# Patient Record
Sex: Female | Born: 1980 | Race: Black or African American | Hispanic: No | Marital: Married | Smoking: Never smoker
Health system: Southern US, Community
[De-identification: ages and names within clinical notes are randomized; demographics above are authoritative.]

## PROBLEM LIST (undated history)

## (undated) DIAGNOSIS — L309 Dermatitis, unspecified: Secondary | ICD-10-CM

---

## 2004-09-25 ENCOUNTER — Emergency Department (HOSPITAL_COMMUNITY): Admission: EM | Admit: 2004-09-25 | Discharge: 2004-09-25 | Payer: Self-pay | Admitting: Family Medicine

## 2005-09-26 ENCOUNTER — Emergency Department (HOSPITAL_COMMUNITY): Admission: EM | Admit: 2005-09-26 | Discharge: 2005-09-26 | Payer: Self-pay | Admitting: Family Medicine

## 2007-02-15 ENCOUNTER — Emergency Department (HOSPITAL_COMMUNITY): Admission: EM | Admit: 2007-02-15 | Discharge: 2007-02-15 | Payer: Self-pay | Admitting: Emergency Medicine

## 2007-02-22 ENCOUNTER — Ambulatory Visit (HOSPITAL_COMMUNITY): Admission: RE | Admit: 2007-02-22 | Discharge: 2007-02-23 | Payer: Self-pay | Admitting: Specialist

## 2008-10-28 ENCOUNTER — Emergency Department (HOSPITAL_COMMUNITY): Admission: EM | Admit: 2008-10-28 | Discharge: 2008-10-28 | Payer: Self-pay | Admitting: Family Medicine

## 2008-11-09 IMAGING — RF DG ANKLE COMPLETE 3+V*L*
1 series · 4 of 4 positions shown · non-contrast
Comparison: none

CLINICAL DATA: Left ankle fracture, internal fixation. 
 LEFT ANKLE - 4 VIEW:

[Series 1: run · 4 of 4 slices shown]
[im 1/4]
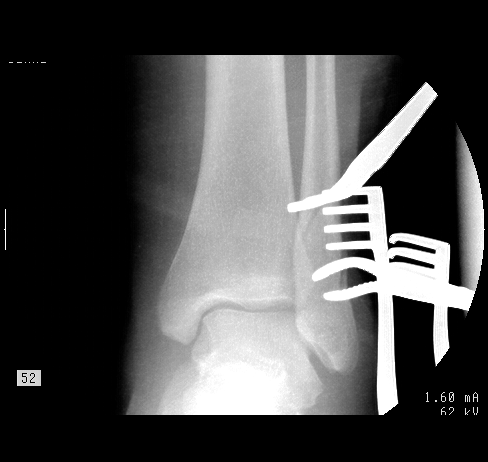
[im 2/4]
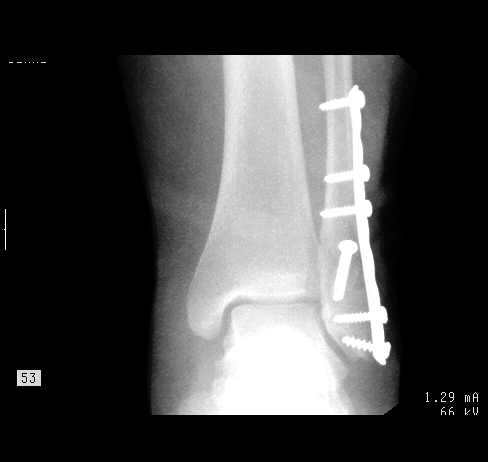
[im 3/4]
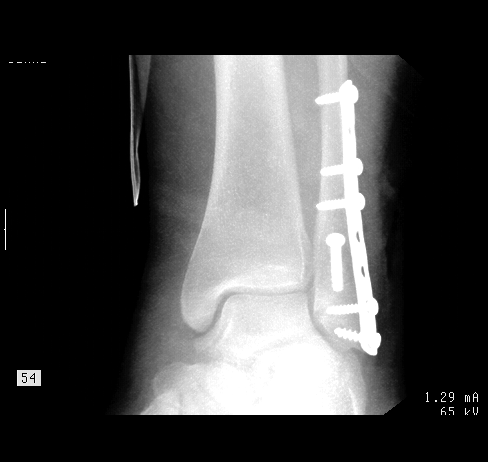
[im 4/4]
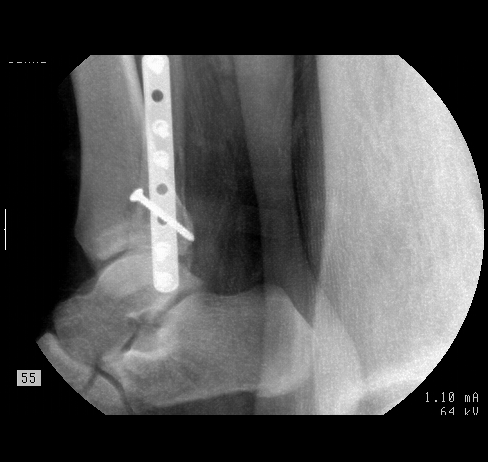

[4 of 4 positions shown; findings below may reference images not displayed]

FINDINGS: Four intraoperative spot films of the left ankle are submitted postoperatively for interpretation. These demonstrate internal plate and screw fixation of distal fibular fracture in near anatomic alignment and position.
IMPRESSION: Plate and screw fixation of the distal fibular fracture in near anatomic alignment and position.

## 2011-01-03 LAB — POCT URINALYSIS DIP (DEVICE)
Bilirubin Urine: NEGATIVE
Nitrite: NEGATIVE
Protein, ur: NEGATIVE mg/dL
pH: 6 (ref 5.0–8.0)

## 2011-01-03 LAB — POCT PREGNANCY, URINE: Preg Test, Ur: NEGATIVE

## 2011-01-31 NOTE — Op Note (Signed)
NAME:  Kelsey Parker, Kelsey Parker               ACCOUNT NO.:  [id number]   MEDICAL RECORD NO.:  [id number]          PATIENT TYPE:  OIB   LOCATION:  1615                         FACILITY:  [facility]   PHYSICIAN:  Jene Every, M.D.    DATE OF BIRTH:  07-30-1981   DATE OF PROCEDURE:  02/22/2007  DATE OF DISCHARGE:                               OPERATIVE REPORT   PREOPERATIVE DIAGNOSIS:  Fibular fracture, widening of the mortise,  left.   POSTOPERATIVE DIAGNOSIS:  Fibular fracture, widening of the mortise,  left.   PROCEDURE PERFORMED:  1. Open reduction and internal fixation left fibula.  2. Placement of short leg cast.   ANESTHESIA:  General.   SURGEON:  Jene Every, M.D.   ASSISTANT:  None.   BRIEF HISTORY AND INDICATIONS:  This is a 30 year old with fractured  fibula, widening of the mortise, operative intervention is indicated for  open reduction and internal fixation of the fibula for restoration of  the mortise and possible deltoid repair.  The risks and benefits were  discussed with him including bleeding, infection, damage to  neurovascular structures, DVT, PE, anesthetic complications, suboptimal  range of motion, need for hardware removal, etc.   SURGICAL TECHNIQUE:  With the patient in the supine position, after the  induction of adequate general anesthesia and 2 grams Kefzol, the left  lower extremity was prepped and draped in the usual sterile fashion.  The thigh tourniquet was inflated to 250 mmHg.  An incision was made  over the fibula.  The subcutaneous tissue was dissected.  Electrocautery  was utilized to achieve hemostasis.  The branches of the superficial  peroneal nerve were preserved.  The fracture site was identified.  It  was an old fracture, 29-95 days old.  It was fairly stiff.  We had to  open and curet the fracture site, curet early callus.  This was curetted  in its entirety, mobilized, and reduced anatomically with a tenaculum.  I then placed a lag screw from  cephalad to caudad and anterior to  posterior after over drilling with 3.5, drilling with the 2.5, insertion  of the appropriate length, fully threaded cortical.  Excellent reduction  with expression of fracture hematoma.  I placed an eight hole 1/3  tubular plate contoured to the inner aspect of the fibula, anti-glide,  secured distally with two cancellous screws, proximally with three  cortical screws, after the appropriate drilling, depth gauge  measurement, insertion of appropriate lag screw with excellent purchase.  The wound was copiously irrigated with the foot in supination, I felt  there was anatomic reduction of the mortise and anatomic fixation of the  fracture in the AP and lateral plane.  Next, I copiously irrigated and  closed the subcutaneous tissue with 0 and 2-0 Vicryl simple sutures, the  skin was reapproximated with staples.  Again, care was taken not to  incarcerate any of the superficial branch of the peroneal nerve.  The  tourniquet was deflated and there was adequate revascularization of the  lower extremity appreciated.  Short leg with the foot in supination was  placed, fiberglass  utilizing cold water.  Everything was well padded.  After the  appropriate curing, we placed it on top of a pillow.  The patient was  extubated without difficulty and transferred to the recovery room in  satisfactory condition.  The patient tolerated the procedure well.  No  complications.  Tourniquet time was 1 hour.      Jene Every, M.D.  Electronically Signed     JB/MEDQ  D:  02/22/2007  T:  02/22/2007  Job:  [id number]

## 2011-07-06 LAB — PREGNANCY, URINE: Preg Test, Ur: NEGATIVE

## 2020-09-08 ENCOUNTER — Ambulatory Visit (HOSPITAL_COMMUNITY)
Admission: EM | Admit: 2020-09-08 | Discharge: 2020-09-08 | Disposition: A | Discharge status: Home / Self Care | Payer: BC Managed Care – PPO | Attending: Family Medicine | Admitting: Family Medicine

## 2020-09-08 ENCOUNTER — Other Ambulatory Visit: Payer: Self-pay

## 2020-09-08 ENCOUNTER — Encounter (HOSPITAL_COMMUNITY): Payer: Self-pay | Admitting: Emergency Medicine

## 2020-09-08 DIAGNOSIS — L309 Dermatitis, unspecified: Secondary | ICD-10-CM | POA: Diagnosis not present

## 2020-09-08 HISTORY — DX: Dermatitis, unspecified: L30.9

## 2020-09-08 MED ORDER — TRIAMCINOLONE ACETONIDE 40 MG/ML IJ SUSP
40.0000 mg | Freq: Once | INTRAMUSCULAR | Status: AC
  Administered 2020-09-08: 40 mg via INTRAMUSCULAR

## 2020-09-08 MED ORDER — TRIAMCINOLONE ACETONIDE 40 MG/ML IJ SUSP
INTRAMUSCULAR | Status: AC
  Filled 2020-09-08: qty 1

## 2020-09-08 MED ORDER — TRIAMCINOLONE ACETONIDE 0.1 % EX CREA: 1.0000 "application " | TOPICAL_CREAM | Freq: Two times a day (BID) | CUTANEOUS | 1 refills | Status: DC

## 2020-09-08 NOTE — ED Triage Notes (Signed)
Pt states that she noticed  her eczema flare up started a month ago. She states that she tried to get in with her Dermatologist but the next appt is three months out from now. Pt states that she normally uses triamcinolone.

## 2020-09-09 NOTE — ED Provider Notes (Signed)
[facility]    CSN: [id number] Arrival date & time: 09/08/20  1938      History   Chief Complaint Chief Complaint  Patient presents with   Eczema    HPI Kelsey Parker is a 39 y.o. female.   Patient presenting today with diffuse eczema flare across abdomen, back and neck that has been progressing the past month since being out of her triamcinolone cream. Typically followed by Dermatology for this but cannot get an appt for a few weeks. Very itchy and uncomfortable. Trying OTC aveeno without any relief. No other concerns or sxs today.      Past Medical History:  Diagnosis Date   Eczema     There are no problems to display for this patient.   History reviewed. No pertinent surgical history.  OB History   No obstetric history on file.      Home Medications    Prior to Admission medications   Medication Sig Start Date End Date Taking? Authorizing Provider  triamcinolone (KENALOG) 0.1 % Apply 1 application topically 2 (two) times daily. 09/08/20   Particia Nearing, PA-C    Family History Family History  Problem Relation Age of Onset   Diabetes Mother     Social History Social History   Tobacco Use   Smoking status: Never Smoker   Smokeless tobacco: Never Used  Substance Use Topics   Alcohol use: Yes    Comment: social   Drug use: Never     Allergies   Patient has no known allergies.   Review of Systems Review of Systems PER HPI    Physical Exam Triage Vital Signs ED Triage Vitals  Enc Vitals Group     BP 09/08/20 2011 (!) 150/91     Pulse Rate 09/08/20 2011 69     Resp 09/08/20 2011 20     Temp --      Temp Source 09/08/20 2011 Oral     SpO2 09/08/20 2011 98 %     Weight --      Height --      Head Circumference --      Peak Flow --      Pain Score 09/08/20 2006 0     Pain Loc --      Pain Edu? --      Excl. in GC? --    No data found.  Updated Vital Signs BP (!) 150/91 (BP Location: Left Wrist)     Pulse 69    Resp 20    LMP 08/23/2020 (Approximate)    SpO2 98%   Visual Acuity Right Eye Distance:   Left Eye Distance:   Bilateral Distance:    Right Eye Near:   Left Eye Near:    Bilateral Near:     Physical Exam Vitals and nursing note reviewed.  Constitutional:      Appearance: Normal appearance. She is not ill-appearing.  HENT:     Head: Atraumatic.  Eyes:     Extraocular Movements: Extraocular movements intact.     Conjunctiva/sclera: Conjunctivae normal.  Cardiovascular:     Rate and Rhythm: Normal rate and regular rhythm.     Heart sounds: Normal heart sounds.  Pulmonary:     Effort: Pulmonary effort is normal.     Breath sounds: Normal breath sounds.  Musculoskeletal:        General: Normal range of motion.     Cervical back: Normal range of motion and neck supple.  Skin:  General: Skin is warm and dry.     Findings: Rash (hyperpigmented maculopapular rash across neck b/l, abdomen and back with areas of scabbing from itching) present.  Neurological:     Mental Status: She is alert and oriented to person, place, and time.  Psychiatric:        Mood and Affect: Mood normal.        Thought Content: Thought content normal.        Judgment: Judgment normal.      UC Treatments / Results  Labs (all labs ordered are listed, but only abnormal results are displayed) Labs Reviewed - No data to display  EKG   Radiology No results found.  Procedures Procedures (including critical care time)  Medications Ordered in UC Medications  triamcinolone acetonide (KENALOG-40) injection 40 mg (40 mg Intramuscular Given 09/08/20 2040)    Initial Impression / Assessment and Plan / UC Course  I have reviewed the triage vital signs and the nursing notes.  Pertinent labs & imaging results that were available during my care of the patient were reviewed by me and considered in my medical decision making (see chart for details).     IM kenalog, triamcinolone script  given today until she can f/u with Dermatology for recheck. Discussed good moisturizing regimen and other home care.   Final Clinical Impressions(s) / UC Diagnoses   Final diagnoses:  None   Discharge Instructions   None    ED Prescriptions    Medication Sig Dispense Auth. Provider   triamcinolone (KENALOG) 0.1 % Apply 1 application topically 2 (two) times daily. 453.6 g Particia Nearing, [place]     PDMP not reviewed this encounter.   Particia Nearing, [place] 09/09/20 1036

## 2021-09-14 ENCOUNTER — Other Ambulatory Visit: Payer: Self-pay | Admitting: Family Medicine

## 2021-09-15 NOTE — Telephone Encounter (Signed)
Requested Prescriptions  Pending Prescriptions Disp Refills   triamcinolone cream (KENALOG) 0.1 % [Pharmacy Med Name: TRIAMCINOLONE 0.1% CREAM 454GM] 454 g     Sig: APPLY TOPICALLY TWICE DAILY     Dermatology:  Corticosteroids Failed - 09/14/2021  6:55 PM      Failed - Valid encounter within last 12 months    Recent Outpatient Visits   None

## 2021-12-13 ENCOUNTER — Other Ambulatory Visit: Payer: Self-pay

## 2021-12-13 ENCOUNTER — Ambulatory Visit: Payer: BC Managed Care – PPO | Admitting: Physician Assistant

## 2021-12-13 ENCOUNTER — Encounter: Payer: Self-pay | Admitting: Physician Assistant

## 2021-12-13 ENCOUNTER — Telehealth: Payer: Self-pay | Admitting: *Deleted

## 2021-12-13 DIAGNOSIS — L209 Atopic dermatitis, unspecified: Secondary | ICD-10-CM

## 2021-12-13 DIAGNOSIS — L304 Erythema intertrigo: Secondary | ICD-10-CM

## 2021-12-13 MED ORDER — TACROLIMUS 0.1 % EX OINT: TOPICAL_OINTMENT | Freq: Every day | CUTANEOUS | 1 refills | Status: AC

## 2021-12-13 MED ORDER — TRIAMCINOLONE ACETONIDE 0.1 % EX OINT: 1.0000 "application " | TOPICAL_OINTMENT | Freq: Every day | CUTANEOUS | 6 refills | Status: AC

## 2021-12-13 NOTE — Progress Notes (Signed)
? ?  New Patient ?  ?Subjective  ?Kelsey Parker is a 41 y.o. female who presents for the following: New Patient (Initial Visit) (Patient here today for eczema x years per patient she needs a refill on her TAC which has helped in the past. Per patient she has eczema on her arms, face and back, patient would like to discuss other treatment options. ). She also gets irritation under her breasts especially in the summer when it gets hot. 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  ? ? ?The following portions of the chart were reviewed this encounter and updated as appropriate:  Tobacco  Allergies  Meds  Problems  Med Hx  Surg Hx  Fam Hx   ?  ? ?Objective  ?Well appearing patient in no apparent distress; mood and affect are within normal limits. ? ?A full examination was performed including scalp, head, eyes, ears, nose, lips, neck, chest, axillae, abdomen, back, buttocks, bilateral upper extremities, bilateral lower extremities, hands, feet, fingers, toes, fingernails, and toenails. All findings within normal limits unless otherwise noted below. ? ?Chest (Upper Torso, Anterior), Head - Anterior (Face), Left Antecubital Fossa, Left Popliteal Fossa, Right Antecubital Fossa, Right Popliteal Fossa, Scalp, Torso - Posterior (Back) ?Hyperkeratosis and hyperpigmented scale ? ? ? ? ? ? ? ? ? ? ? ? ? ? ? ? ? ? ?Left Inframammary Fold, Right Inframammary Fold ?Erythema and hyperpigmented scale ? ? ?Assessment & Plan  ?Atopic dermatitis, unspecified type ?Head - Anterior (Face); Left Antecubital Fossa; Right Antecubital Fossa; Left Popliteal Fossa; Right Popliteal Fossa; Chest (Upper Torso, Anterior); Torso - Posterior (Back); Scalp ? ?tacrolimus (PROTOPIC) 0.1 % ointment - Chest (Upper Torso, Anterior), Head - Anterior (Face), Left Antecubital Fossa, Left Popliteal Fossa, Right Antecubital Fossa, Right Popliteal Fossa, Scalp, Torso - Posterior (Back) ?Apply topically at bedtime. ? ?triamcinolone ointment  (KENALOG) 0.1 % - Chest (Upper Torso, Anterior), Head - Anterior (Face), Left Antecubital Fossa, Left Popliteal Fossa, Right Antecubital Fossa, Right Popliteal Fossa, Scalp, Torso - Posterior (Back) ?Apply 1 application. topically daily. ? ?Erythema intertrigo ?Left Inframammary Fold; Right Inframammary Fold ? ?OTC clotrimazole and hydrocortisone- mix together and apply once to twice a day. Dry thoroughly after bathing.  ? ? ? ? ?I, Mckinsley Koelzer, PA-C, have reviewed all documentation's for this visit.  The documentation on 12/13/21 for the exam, diagnosis, procedures and orders are all accurate and complete. ?

## 2021-12-13 NOTE — Patient Instructions (Addendum)
Dupilumab Injection ?What is this medication? ?DUPILUMAB (doo PIL ue mab) treats eczema, eosinophilic esophagitis, and sinus inflammation with nasal polyps. It decreases inflammation that contributes to these conditions. It is also used to prevent the symptoms of asthma. It works by decreasing inflammation of the airways, making it easier to breathe. Do not use it to treat a sudden asthma attack. ?This medicine may be used for other purposes; ask your health care provider or pharmacist if you have questions. ?COMMON BRAND NAME(S): DUPIXENT ?What should I tell my care team before I take this medication? ?They need to know if you have any of these conditions: ?Asthma ?Eye disease ?Parasitic (helminth) infection ?An unusual or allergic reaction to dupilumab, other medicines, foods, dyes, or preservatives ?Pregnant or trying to get pregnant ?Breast-feeding ?How should I use this medication? ?This medication is injected under the skin. You will be taught how to prepare and give it. Take it as directed on the prescription label. Keep taking it unless your care team tells you to stop. ?If you use a pen, be sure to take off the outer needle cover before using the dose. ?It is important that you put your used needles and syringes in a special sharps container. Do not put them in a trash can. If you do not have a sharps container, call your pharmacist or care team to get one. ?A patient package insert for the product will be given with each prescription and refill. Be sure to read this information carefully each time. The sheet may change often. ?This medication comes with INSTRUCTIONS FOR USE. Ask your pharmacist for directions on how to use this medication. Read the information carefully. Talk to your care team if you have questions. ?Talk to your care team about the use of this medication in children. While this medication may be prescribed for children as young as 6 years for selected conditions, precautions do  apply. ?Overdosage: If you think you have taken too much of this medicine contact a poison control center or emergency room at once. ?NOTE: This medicine is only for you. Do not share this medicine with others. ?What if I miss a dose? ?It is important not to miss any doses. Talk to your care team about what to do if you miss a dose. ?What may interact with this medication? ?Interactions are not expected. ?This list may not describe all possible interactions. Give your health care provider a list of all the medicines, herbs, non-prescription drugs, or dietary supplements you use. Also tell them if you smoke, drink alcohol, or use illegal drugs. Some items may interact with your medicine. ?What should I watch for while using this medication? ?Visit your care team for regular checks on your progress. Tell your care team if your symptoms do not start to get better or if they get worse. ?This medication can decrease the response to a vaccine. If you need to get vaccinated, tell your care team if you have received this medication. Talk to your care team to see if a different vaccination schedule is needed. ?If you take this medication for asthma, you and your care team should develop an Asthma Action Plan that is just for you. Be sure to know what to do if you are in the yellow (asthma is getting worse) or red (medical alert) zones. This medication is not used to treat sudden breathing problems. ?What side effects may I notice from receiving this medication? ?Side effects that you should report to your care team as  soon as possible: ?Allergic reactions--skin rash, itching, hives, swelling of the face, lips, tongue, or throat ?Severe joint pain ?Sudden eye pain or change in vision such as blurry vision ?Vasculitis--Unusual weakness or fatigue, fever, headache, skin rash, muscle or joint pain, loss of appetite, pain, tingling, or numbness in the hands or feet ?Side effects that usually do not require medical attention  (report these to your care team if they continue or are bothersome): ?Cold sore ?Dry eyes ?Joint pain ?Pain, redness, or irritation at injection site ?Sore throat ?Trouble sleeping ?This list may not describe all possible side effects. Call your doctor for medical advice about side effects. You may report side effects to FDA at 1-800-FDA-1088. ?Where should I keep my medication? ?Keep out of the reach of children and pets. ?Store in the refrigerator at 2 to 8 degrees C (36 to 46 degrees F). Do not freeze. Do not shake. Keep this medication in the original packaging until you are ready to take it. Protect from light. Get rid of any unused medication after the expiration date. ?This medication may be stored at room temperature up to 25 degrees C (77 degrees F) for up to 14 days. Keep this medication in the original packaging. Protect from light. If it is stored at room temperature, get rid of any unused medication after 14 days or after it expires, whichever is first. ?To get rid of medications that are no longer needed or have expired: ?Take the medication to a medication take-back program. Check with your pharmacy or law enforcement to find a location. ?If you cannot return the medication, ask your pharmacist or care team how to get rid of this medication safely. ?NOTE: This sheet is a summary. It may not cover all possible information. If you have questions about this medicine, talk to your doctor, pharmacist, or health care provider. ?? 2022 Elsevier/Gold Standard (2021-02-15 00:00:00) ? ? ?Clotrimazole and Hydrocortisone for under your breast.  ? ?

## 2021-12-13 NOTE — Telephone Encounter (Signed)
Sent patients information to [place] my way and uploaded patients new prescription and office notes to the [facility] portal.  ?

## 2021-12-14 NOTE — Telephone Encounter (Signed)
[facility]up[facility] my way received patients enrollment form.  ?

## 2021-12-19 ENCOUNTER — Telehealth: Payer: Self-pay

## 2021-12-19 NOTE — Telephone Encounter (Signed)
Spoke with Shay from [employer] about the patient's BSA%. ? ? ? ?10:32Shay Freida Busman joined the chat ?10:32Chat assigned to [employer] ?Hello, how can I help you today? ? ? ?10:32 ?Hello, you all requested a BSA % on patient Kelsey Parker DOB 1981/04/05 ? ? ?10:33 ?If you check the electronic prescription the BSA is listed as 30% ? ? ?10:34 ?Yes, I see it here. I'm sorry that the fax was sent to you. I will send this back over to the PA team ? ?10:34 ?Thank you ? ? ?10:34 ? ?

## 2021-12-19 NOTE — Telephone Encounter (Signed)
Fax received from [place] stating the need the patient's BSA%.  ?

## 2022-01-05 ENCOUNTER — Telehealth: Payer: Self-pay

## 2022-01-05 NOTE — Telephone Encounter (Signed)
Phone call to patient to let her know that we will need her to come by the office to sign the appeal authorization for her [place]. Voicemail left for patient to give the office a call back.  ?

## 2022-01-05 NOTE — Telephone Encounter (Signed)
Fax received from patient's insurance needing her to sign an appeal authorization. And a letter of medical necessity to be sign by the provider.  ?

## 2022-01-11 NOTE — Telephone Encounter (Signed)
Phone call to patient to inform her that we need her to come by and sign authorization for [place] to do an appeal for her [place]. Patient states that she's received her Dupixent and she needs to know how to do the injection. Patient scheduled nurse visit for Wednesday for injection training.  ?

## 2022-01-18 ENCOUNTER — Ambulatory Visit (INDEPENDENT_AMBULATORY_CARE_PROVIDER_SITE_OTHER): Payer: BC Managed Care – PPO | Admitting: *Deleted

## 2022-01-18 DIAGNOSIS — L209 Atopic dermatitis, unspecified: Secondary | ICD-10-CM

## 2022-01-18 MED ORDER — DUPILUMAB 300 MG/2ML ~~LOC~~ SOAJ
600.0000 mg | Freq: Once | SUBCUTANEOUS | Status: AC
  Administered 2022-01-18: 600 mg via SUBCUTANEOUS

## 2022-01-18 NOTE — Progress Notes (Signed)
Here for Dupixent training. Injected 600 mg in right and left thigh. Patient tolerated well.  ? ?[id number] ?EXP-2023-11-16 ?

## 2022-01-23 ENCOUNTER — Telehealth: Payer: Self-pay | Admitting: *Deleted

## 2022-01-23 NOTE — Telephone Encounter (Signed)
Sent appeal letter in to [facility] via the portal.  ?

## 2022-01-23 NOTE — Telephone Encounter (Signed)
Faxed the appeal letter to [facility] for patients Dupixent.  ?

## 2022-02-23 ENCOUNTER — Telehealth: Payer: Self-pay | Admitting: *Deleted

## 2022-02-23 NOTE — Telephone Encounter (Signed)
Phone call from [facility] saying they don't have any documentation that she has tried and failed the tacrolimus and triamcinolone. Told her she had full body eczema and a topical wont help. She said this will probably be denied. Patient should be able to get [facility] Jacinto med through my way if denied.

## 2022-04-12 ENCOUNTER — Telehealth: Payer: Self-pay | Admitting: Physician Assistant

## 2022-12-20 ENCOUNTER — Ambulatory Visit: Payer: BC Managed Care – PPO | Admitting: Physician Assistant

## 2024-02-20 ENCOUNTER — Ambulatory Visit: Payer: Self-pay

## 2024-02-20 NOTE — Telephone Encounter (Signed)
 FYI Only or Action Required?: FYI only for provider  Patient is followed in Pulmonology for New Patient, last seen on NP. Called Nurse Triage reporting Shortness of Breath. Symptoms began several months ago. Interventions attempted: Rescue inhaler and Increased fluids/rest. Symptoms are: unchanged.  Triage Disposition: See Physician Within 24 Hours (overriding See HCP Within 4 Hours (Or PCP Triage))  Patient/caregiver understands and will follow disposition?: Yes        Copied from CRM [id number]. Topic: Clinical - Red Word Triage >> Feb 20, 2024  3:21 PM Eveleen Hinds B wrote: [facility] that prompted transfer to Nurse Triage: shortness of breath Reason for Disposition  [1] MILD difficulty breathing (e.g., minimal/no SOB at rest, SOB with walking, pulse <100) AND [2] NEW-onset or WORSE than normal  Answer Assessment - Initial Assessment Questions 1. RESPIRATORY STATUS: "Describe your breathing?" (e.g., wheezing, shortness of breath, unable to speak, severe coughing)      Shortness of Breath 2. ONSET: "When did this breathing problem begin?"      Last September 3. PATTERN "Does the difficult breathing come and go, or has it been constant since it started?"      Intermittent 4. SEVERITY: "How bad is your breathing?" (e.g., mild, moderate, severe)    - MILD: No SOB at rest, mild SOB with walking, speaks normally in sentences, can lie down, no retractions, pulse < 100.    - MODERATE: SOB at rest, SOB with minimal exertion and prefers to sit, cannot lie down flat, speaks in phrases, mild retractions, audible wheezing, pulse 100-120.    - SEVERE: Very SOB at rest, speaks in single words, struggling to breathe, sitting hunched forward, retractions, pulse > 120      Moderate at times, SOB easier  6. CARDIAC HISTORY: "Do you have any history of heart disease?" (e.g., heart attack, angina, bypass surgery, angioplasty)      None 7. LUNG HISTORY: "Do you have any history of lung disease?"  (e.g.,  pulmonary embolus, asthma, emphysema)     None 8. CAUSE: "What do you think is causing the breathing problem?"      Unknown 9. OTHER SYMPTOMS: "Do you have any other symptoms? (e.g., dizziness, runny nose, cough, chest pain, fever)     Chest tightness, cough, runny nose 10. O2 SATURATION MONITOR:  "Do you use an oxygen saturation monitor (pulse oximeter) at home?" If Yes, ask: "What is your reading (oxygen level) today?" "What is your usual oxygen saturation reading?" (e.g., 95%)       Drops to 93% with activity   Pt reports ongoing SOB since last September. Intermittent with coughing episodes/exertion Monitoring SpO2 levels, 93% when SOB  Protocols used: Breathing Difficulty-A-AH

## 2024-02-21 ENCOUNTER — Ambulatory Visit (HOSPITAL_BASED_OUTPATIENT_CLINIC_OR_DEPARTMENT_OTHER): Payer: Self-pay | Admitting: Adult Health

## 2024-02-21 NOTE — Telephone Encounter (Signed)
 Pt notified we have nothing earlier available for an appt. She should followup with her PCP in the meantime of waiting for her appt

## 2024-04-18 ENCOUNTER — Encounter: Payer: Self-pay | Admitting: Pulmonary Disease

## 2024-04-18 ENCOUNTER — Ambulatory Visit: Payer: Self-pay | Admitting: Pulmonary Disease

## 2024-04-18 ENCOUNTER — Ambulatory Visit: Payer: Self-pay | Admitting: Internal Medicine

## 2024-04-18 ENCOUNTER — Ambulatory Visit

## 2024-04-18 VITALS — BP 134/82 | HR 72 | Temp 98.9°F | Ht 67.0 in | Wt 320.6 lb

## 2024-04-18 DIAGNOSIS — R0689 Other abnormalities of breathing: Secondary | ICD-10-CM

## 2024-04-18 DIAGNOSIS — R0600 Dyspnea, unspecified: Secondary | ICD-10-CM

## 2024-04-18 LAB — CBC WITH DIFFERENTIAL/PLATELET
Basophils Absolute: 0.1 K/uL (ref 0.0–0.1)
Basophils Relative: 0.8 % (ref 0.0–3.0)
Eosinophils Absolute: 0.3 K/uL (ref 0.0–0.7)
Eosinophils Relative: 4.3 % (ref 0.0–5.0)
HCT: 32.6 % — ABNORMAL LOW (ref 36.0–46.0)
Hemoglobin: 10.3 g/dL — ABNORMAL LOW (ref 12.0–15.0)
Lymphocytes Relative: 43.5 % (ref 12.0–46.0)
Lymphs Abs: 2.8 K/uL (ref 0.7–4.0)
MCHC: 31.5 g/dL (ref 30.0–36.0)
MCV: 70.7 fl — ABNORMAL LOW (ref 78.0–100.0)
Monocytes Absolute: 0.4 K/uL (ref 0.1–1.0)
Monocytes Relative: 6.4 % (ref 3.0–12.0)
Neutro Abs: 2.9 K/uL (ref 1.4–7.7)
Neutrophils Relative %: 45 % (ref 43.0–77.0)
Platelets: 235 K/uL (ref 150.0–400.0)
RBC: 4.6 Mil/uL (ref 3.87–5.11)
RDW: 18.4 % — ABNORMAL HIGH (ref 11.5–15.5)
WBC: 6.4 K/uL (ref 4.0–10.5)

## 2024-04-18 MED ORDER — BUDESONIDE-FORMOTEROL FUMARATE 160-4.5 MCG/ACT IN AERO: 2.0000 | INHALATION_SPRAY | Freq: Two times a day (BID) | RESPIRATORY_TRACT | 11 refills | Status: AC

## 2024-04-18 NOTE — Progress Notes (Addendum)
 Kelsey Parker    [id number]    24-Nov-1980  Primary Care Physician:Shelton, Suzen, MD  Referring Physician: Theo Suzen, MD [address] [place],  [place] [address]  Chief complaint: Consult for asthma  HPI: 43 y.o. who  has a past medical history of Eczema.  Discussed the use of AI scribe software for clinical note transcription with the patient, who gave verbal consent to proceed.  History of Present Illness Kelsey Parker is a 43 year old female who presents with shortness of breath and cough.  Dyspnea and cough - Shortness of breath triggered by dust and physical exertion - No symptoms triggered by perfumes, pollution, or smoke - Severe episode of shortness of breath in April or May requiring EMS intervention and improved with breathing treatment - Persistent cough with mucus production since the severe episode - Antibiotics reduced cough but did not resolve it completely - History of prolonged cough after possible COVID-19 infection - No chest x-ray performed during urgent care visit for suspected bronchitis  Environmental and occupational exposures - Cats and dogs trigger respiratory symptoms - No visible mold exposure at home - Works as a Public relations account executive; prior employment as a Lawyer  Medication use and response - Used an inhaler borrowed from her aunt during episodes of shortness of breath, ineffective during severe episode - No current medications other than tacrolimus  ointment for eczema  Atopic and allergic history - Eczema treated with tacrolimus  ointment  Tobacco use - No current smoking - Tried smoking when younger  Family history - No known lung disease in family except aunt with asthma     Relevant Pulmonary history: Pets: Occupation: Exposures: No h/o chemo/XRT/amiodarone/macrodantin/MTX  No exposure to asbestos, silica or other organic allergens  Smoking history: Travel history: Family history:   Outpatient  Encounter Medications as of 04/18/2024  Medication Sig   tacrolimus  (PROTOPIC ) 0.1 % ointment Apply topically at bedtime.   triamcinolone  ointment (KENALOG ) 0.1 % Apply 1 application. topically daily. (Patient not taking: Reported on 04/18/2024)   No facility-administered encounter medications on file as of 04/18/2024.     Physical Exam: Today's Vitals   04/18/24 0940  BP: 134/82  Pulse: 72  Temp: 98.9 F (37.2 C)  TempSrc: Oral  SpO2: 98%  Weight: (!) 320 lb 9.6 oz (145.4 kg)  Height: 5' 7 (1.702 m)   Body mass index is 50.21 kg/m.  Physical Exam GEN: No acute distress. CV: Regular rate and rhythm, no murmurs. LUNGS: Clear to auscultation bilaterally, normal respiratory effort. SKIN JOINTS: Warm and dry, no rash.    Data Reviewed: Imaging:  PFTs:  Labs:  Assessment and Plan Assessment & Plan Asthma with cough and sputum production Asthma is suspected due to symptoms of shortness of breath, cough, and sputum production, triggered by dust, exercise, and possibly allergens. Eczema suggests an allergic component. Previous improvement with antibiotics and inhaler use supports asthma diagnosis over bronchitis. Asthma involves immune-mediated airway inflammation and mucus production. - Prescribe Symbicort  inhaler with steroid and bronchodilator for twice daily use to reduce inflammation and relax airways. - Order labs to check for allergic inflammation. - Order chest x-ray to rule out other causes of symptoms. - Plan for follow-up lung function test after initial treatment.  Shortness of breath Shortness of breath is associated with asthma symptoms, occurring with exertion. Other potential causes include cardiac issues or weight-related factors. Improvement is expected with asthma treatment, but further evaluation may be needed if symptoms  persist. - Evaluate response to inhaler treatment for asthma. - Consider cardiac evaluation if shortness of breath persists despite asthma  treatment. - Discuss weight management as a potential factor in shortness of breath.   Recommendations: Symbicort  Check CBC with differential, IgE Chest x-ray PFTs  Lonna Coder MD [facility] 04/18/2024, 9:48 AM  CC: Theo Iha, MD

## 2024-04-18 NOTE — Patient Instructions (Signed)
  VISIT SUMMARY: Kelsey Parker, a 43 year old female, visited today due to shortness of breath and a persistent cough. These symptoms are triggered by dust and physical exertion, and she has a history of a severe episode requiring emergency intervention. She has also experienced prolonged cough after a possible COVID-19 infection. Her work and home environments may contribute to her symptoms. She has used an inhaler in the past with limited success and currently uses tacrolimus  ointment for eczema. There is no significant family history of lung disease.  YOUR PLAN: -ASTHMA WITH COUGH AND SPUTUM PRODUCTION: Asthma is a condition where your airways become inflamed and produce extra mucus, leading to symptoms like shortness of breath, cough, and sputum production. To manage this, you will use an inhaler with a steroid and bronchodilator twice daily to reduce inflammation and relax your airways. We will also conduct lab tests to check for allergic inflammation and a chest x-ray to rule out other causes of your symptoms. A follow-up lung function test will be scheduled after your initial treatment.  -SHORTNESS OF BREATH: Shortness of breath can be a symptom of asthma, but it can also be related to heart issues or weight. We will first see how you respond to the asthma treatment. If your shortness of breath continues, we may need to evaluate your heart. Additionally, we discussed that managing your weight could help improve your symptoms.  INSTRUCTIONS: Please use the prescribed inhaler with a steroid and bronchodilator twice daily. Complete the lab tests and chest x-ray as ordered. Follow up with us  for a lung function test after your initial treatment. If your shortness of breath persists, we may need to consider a cardiac evaluation. Additionally, consider weight management strategies to help with your symptoms.                      Contains text generated by [employer].                                  Contains text generated by [employer].

## 2024-04-21 LAB — IGE: IgE (Immunoglobulin E), Serum: 613 kU/L — ABNORMAL HIGH (ref <=114)

## 2024-04-29 ENCOUNTER — Ambulatory Visit: Payer: Self-pay | Admitting: Pulmonary Disease

## 2024-05-06 NOTE — Telephone Encounter (Signed)
**Note De-identified  [place] Obfuscation** Please advise 

## 2024-05-13 NOTE — Telephone Encounter (Signed)
 Low levels of hemoglobin and other abnormalities may be due to iron deficiency anemia.  I would advise to check in with your primary care doctor for follow-up and treatment.

## 2024-07-31 ENCOUNTER — Encounter

## 2024-07-31 ENCOUNTER — Ambulatory Visit: Admitting: Pulmonary Disease

## 2024-07-31 ENCOUNTER — Encounter: Payer: Self-pay | Admitting: Pulmonary Disease
